# Patient Record
Sex: Male | Born: 1955 | Race: White | Hispanic: No | Marital: Single | Smoking: Former smoker
Health system: Southern US, Community
[De-identification: ages and names within clinical notes are randomized; demographics above are authoritative.]

## PROBLEM LIST (undated history)

## (undated) DIAGNOSIS — K219 Gastro-esophageal reflux disease without esophagitis: Secondary | ICD-10-CM

## (undated) DIAGNOSIS — T7840XA Allergy, unspecified, initial encounter: Secondary | ICD-10-CM

## (undated) DIAGNOSIS — Z8619 Personal history of other infectious and parasitic diseases: Secondary | ICD-10-CM

## (undated) DIAGNOSIS — B019 Varicella without complication: Secondary | ICD-10-CM

## (undated) HISTORY — DX: Allergy, unspecified, initial encounter: T78.40XA

## (undated) HISTORY — DX: Personal history of other infectious and parasitic diseases: Z86.19

## (undated) HISTORY — DX: Gastro-esophageal reflux disease without esophagitis: K21.9

## (undated) HISTORY — DX: Varicella without complication: B01.9

---

## 2001-06-07 HISTORY — PX: COLONOSCOPY: SHX174

## 2005-01-01 ENCOUNTER — Ambulatory Visit: Payer: Self-pay | Admitting: Internal Medicine

## 2005-04-12 ENCOUNTER — Ambulatory Visit: Payer: Self-pay | Admitting: Internal Medicine

## 2009-10-02 ENCOUNTER — Telehealth: Payer: Self-pay | Admitting: Internal Medicine

## 2009-10-06 ENCOUNTER — Ambulatory Visit: Payer: Self-pay | Admitting: Internal Medicine

## 2009-10-06 DIAGNOSIS — J309 Allergic rhinitis, unspecified: Secondary | ICD-10-CM | POA: Insufficient documentation

## 2010-07-07 NOTE — Assessment & Plan Note (Signed)
Summary: REEST/COUGH ALLERGIES/PS   Vital Signs:  [name] profile:   55 year old male Weight:      240 pounds Temp:     98.0 degrees F oral BP sitting:   100 / 60  (right arm) Cuff size:   regular  Vitals Entered By: Duard Brady LPN (Oct 07, 979 4:35 PM) CC: re-establish c/o cough   CC:  re-establish c/o cough.  History of Present Illness: Chase [name] who is seen today to reestablish with our practice.  He has whitish allergic rhinitis and following a URI.  Four weeks ago.  Has had a refractory nonproductive cough, some through the night, but a bit irritating to her today.  He was placed on a prednisone Dosepak two days ago.  He takes Zyrtec for allergic rhinitis.  Preventive Screening-Counseling & Management  Alcohol-Tobacco     Smoking Status: quit  Caffeine-Diet-Exercise     Does [name] Exercise: no  Allergies (verified): No Known Drug Allergies  Past History:  Past Medical History: Allergic rhinitis family history colon cancer  Family History: Reviewed history and no changes required. Father died colon Ca age  76 Mother age 72-  1 brother bladder ca  4 sisters  Social History: Reviewed history and no changes required. Married d/c tobacco 18 yrs ago Regular exercise-no Smoking Status:  quit Does [name] Exercise:  no  Review of Systems       The [name] complains of prolonged cough.  The [name] denies anorexia, fever, weight loss, weight gain, vision loss, decreased hearing, hoarseness, chest pain, syncope, dyspnea on exertion, peripheral edema, headaches, hemoptysis, abdominal pain, melena, hematochezia, severe indigestion/heartburn, hematuria, incontinence, genital sores, muscle weakness, suspicious skin lesions, transient blindness, difficulty walking, depression, unusual weight change, abnormal bleeding, enlarged lymph nodes, angioedema, breast masses, and testicular masses.    Physical Exam  General:  overweight-appearing.   low-normal blood pressureoverweight-appearing.   Head:  Normocephalic and atraumatic without obvious abnormalities. No apparent alopecia or balding. Eyes:  No corneal or conjunctival inflammation noted. EOMI. Perrla. Funduscopic exam benign, without hemorrhages, exudates or papilledema. Vision grossly normal. Ears:  External ear exam shows no significant lesions or deformities.  Otoscopic examination reveals clear canals, tympanic membranes are intact bilaterally without bulging, retraction, inflammation or discharge. Hearing is grossly normal bilaterally. Mouth:  Oral mucosa and oropharynx without lesions or exudates.  Teeth in good repair. Neck:  No deformities, masses, or tenderness noted. Chest Wall:  No deformities, masses, tenderness or gynecomastia noted. Lungs:  Normal respiratory effort, chest expands symmetrically. Lungs are clear to auscultation, no crackles or wheezes. Heart:  Normal rate and regular rhythm. S1 and S2 normal without gallop, murmur, click, rub or other extra sounds. Abdomen:  Bowel sounds positive,abdomen soft and non-tender without masses, organomegaly or hernias noted. Msk:  No deformity or scoliosis noted of thoracic or lumbar spine.   Pulses:  R and L carotid,radial,femoral,dorsalis pedis and posterior tibial pulses are full and equal bilaterally Extremities:  No clubbing, cyanosis, edema, or deformity noted with normal full range of motion of all joints.     Impression & Recommendations:  Problem # 1:  ALLERGIC RHINITIS (ICD-477.9)  His updated medication list for this problem includes:    Alavert 10 Mg Tabs (Loratadine)  His updated medication list for this problem includes:    Alavert 10 Mg Tabs (Loratadine)  Problem # 2:  NEOPLASM, MALIGNANT, COLON, FAMILY HX, FATHER (ICD-V16.0)  Complete Medication List: 1)  Prednisone (pak) 5 Mg Tabs (Prednisone) .... As  directed. 2)  Alavert 10 Mg Tabs (Loratadine) 3)  Hydrocodone-homatropine 5-1.5 Mg/69ml Syrp  (Hydrocodone-homatropine) .Marland Kitchen.. 1 teaspoon every 6 hours as needed for cough  [name] Instructions: 1)  Please schedule a follow-up appointment in 6 months. 2)  It is important that you exercise regularly at least 20 minutes 5 times a week. If you develop chest pain, have severe difficulty breathing, or feel very tired , stop exercising immediately and seek medical attention. 3)  You need to lose weight. Consider a lower calorie diet and regular exercise.  Prescriptions: HYDROCODONE-HOMATROPINE 5-1.5 MG/5ML SYRP (HYDROCODONE-HOMATROPINE) 1 teaspoon every 6 hours as needed for cough  #6 oz x 0   Entered and Authorized by:   Gordy Savers  MD   Signed by:   Gordy Savers  MD on 10/06/2009   Method used:   Print then Give to [name]   RxID:   [id number]

## 2010-07-07 NOTE — Progress Notes (Signed)
Summary: prednisone & alavert  Phone Note Call from Patient Call back at Home Phone [phone]   Reason for Call: Acute Illness Summary of Call: please have a triage nurse handle this  ---- 10/02/2009 9:21 AM, Lucy Antigua wrote: Dr. Caryl Never, Dr Fabian Sharp or Dr. Clent Ridges, Looking for a Dr that can see this pt late afternoon on Friday. Dr. Kirtland Bouchard will be out of the office and said that I can sch with another Dr. [name] was last seen in 2006 and possibly has pneumonia. Pls advise. Thank you.    ---- 10/02/2009 7:56 AM, Gordy Savers  MD wrote: I wil be out of office tomarrow; another MD OK  ---- 10/01/2009 2:06 PM, Lucy Antigua wrote: I rcvd a call from [facility]. Her husband, Chase [name] dob 04-Nov-1955, was a pt of yours and was last seen in 2006 according to [facility]. Pt is wanting to sch an ov to see you asap this Friday, late in the afternoon. Pt says that they may have pneumonia.  Please advise. Thank you.    [phone] Initial call taken by: Lucy Antigua,  October 02, 2009 10:27 AM  Follow-up for Phone Call        Called wife.  No fever.  Has horrible allergies, runny nose, cough nonproductive. Sometimes short breath when working.  Is at work today and cannot take off to reest & be seen.  Talked with wife about need to be seen today if they think he has pneumonia.  She says he does not need to be seen today because he can't get there for a 4pm appt without knowing ahead.   Wife insists he was in office 2008 for checkup & then had colon with [facility] physician.  Gave appointment 4pm 5-2 for symptoms & to reestablish.   Follow-up by: Rudy Jew, RN,  October 02, 2009 10:46 AM  Additional Follow-up for Phone Call Additional follow up Details #1::        noted please call in generic prednisone 6 days dose pack suggest OTV alavert Additional Follow-up by: Gordy Savers  MD,  October 02, 2009 12:37 PM    Additional Follow-up for Phone Call Additional follow up Details #2::    Wife  aware.  [facility]. Follow-up by: Rudy Jew, RN,  October 02, 2009 12:44 PM  New/Updated Medications: PREDNISONE (PAK) 5 MG TABS (PREDNISONE) As directed. ALAVERT 10 MG TABS (LORATADINE)  Prescriptions: PREDNISONE (PAK) 5 MG TABS (PREDNISONE) As directed.  #1 x 0   Entered by:   Rudy Jew, RN   Authorized by:   Gordy Savers  MD   Signed by:   Rudy Jew, RN on 10/02/2009   Method used:   [facility] [place] to ...         RxID:   [id number]

## 2014-01-22 ENCOUNTER — Ambulatory Visit (INDEPENDENT_AMBULATORY_CARE_PROVIDER_SITE_OTHER): Payer: No Typology Code available for payment source | Admitting: Internal Medicine

## 2014-01-22 ENCOUNTER — Ambulatory Visit (INDEPENDENT_AMBULATORY_CARE_PROVIDER_SITE_OTHER)
Admission: RE | Admit: 2014-01-22 | Discharge: 2014-01-22 | Disposition: A | Discharge status: Home / Self Care | Payer: No Typology Code available for payment source | Source: Ambulatory Visit | Attending: Internal Medicine | Admitting: Internal Medicine

## 2014-01-22 ENCOUNTER — Encounter: Payer: Self-pay | Admitting: Internal Medicine

## 2014-01-22 VITALS — BP 106/62 | HR 58 | Temp 98.1°F | Ht 75.0 in | Wt 216.0 lb

## 2014-01-22 DIAGNOSIS — Z575 Occupational exposure to toxic agents in other industries: Secondary | ICD-10-CM

## 2014-01-22 DIAGNOSIS — Z125 Encounter for screening for malignant neoplasm of prostate: Secondary | ICD-10-CM

## 2014-01-22 DIAGNOSIS — Z7729 Contact with and (suspected ) exposure to other hazardous substances: Secondary | ICD-10-CM

## 2014-01-22 DIAGNOSIS — Z0000 Encounter for general adult medical examination without abnormal findings: Secondary | ICD-10-CM

## 2014-01-22 DIAGNOSIS — H60399 Other infective otitis externa, unspecified ear: Secondary | ICD-10-CM

## 2014-01-22 DIAGNOSIS — H60393 Other infective otitis externa, bilateral: Secondary | ICD-10-CM

## 2014-01-22 LAB — COMPREHENSIVE METABOLIC PANEL
ALT: 15 U/L (ref 0–53)
AST: 16 U/L (ref 0–37)
Albumin: 4.3 g/dL (ref 3.5–5.2)
Alkaline Phosphatase: 65 U/L (ref 39–117)
BUN: 14 mg/dL (ref 6–23)
CO2: 27 meq/L (ref 19–32)
CREATININE: 0.9 mg/dL (ref 0.4–1.5)
Calcium: 9.6 mg/dL (ref 8.4–10.5)
Chloride: 104 mEq/L (ref 96–112)
GFR: 87.86 mL/min (ref >60.00)
GLUCOSE: 86 mg/dL (ref 70–99)
Potassium: 4.4 mEq/L (ref 3.5–5.1)
Sodium: 138 mEq/L (ref 135–145)
TOTAL PROTEIN: 7.3 g/dL (ref 6.0–8.3)
Total Bilirubin: 1 mg/dL (ref 0.2–1.2)

## 2014-01-22 LAB — CBC
HEMATOCRIT: 42.4 % (ref 39.0–52.0)
HEMOGLOBIN: 14.1 g/dL (ref 13.0–17.0)
MCHC: 33.1 g/dL (ref 30.0–36.0)
MCV: 95.6 fl (ref 78.0–100.0)
Platelets: 248 10*3/uL (ref 150.0–400.0)
RBC: 4.44 Mil/uL (ref 4.22–5.81)
RDW: 13.9 % (ref 11.5–15.5)
WBC: 7.1 10*3/uL (ref 4.0–10.5)

## 2014-01-22 LAB — PSA: PSA: 0.88 ng/mL (ref 0.10–4.00)

## 2014-01-22 LAB — LIPID PANEL
CHOLESTEROL: 150 mg/dL (ref 0–200)
HDL: 38.7 mg/dL — AB (ref >39.00)
LDL Cholesterol: 95 mg/dL (ref 0–99)
NONHDL: 111.3
Total CHOL/HDL Ratio: 4
Triglycerides: 84 mg/dL (ref 0.0–149.0)
VLDL: 16.8 mg/dL (ref 0.0–40.0)

## 2014-01-22 MED ORDER — NEOMYCIN-POLYMYXIN-HC 3.5-10000-1 OT SOLN: 3.0000 [drp] | Freq: Three times a day (TID) | OTIC | Status: DC

## 2014-01-22 NOTE — Progress Notes (Signed)
Pre visit review using our clinic review tool, if applicable. No additional management support is needed unless otherwise documented below in the visit note. 

## 2014-01-22 NOTE — Addendum Note (Signed)
Addended by: Ellamae Sia on: 01/22/2014 03:15 PM   Modules accepted: Orders

## 2014-01-22 NOTE — Progress Notes (Signed)
HPI  Pt presents to the clinic today to establish care. Chase [name] has not had a PCP in many years. Chase [name] does need a physical exam today including a CBC and chest xray for his job. Chase [name] does have some concerns today about drainage from his ears. Chase [name] reports Chase [name] gets this frequently. The drainage is tan. Chase [name] does have some itching in his ears but not pain or fever.  Flu: never Tetanus:> 10 years ago PSA Screen: never Colon Screen: 2003 Eye Doctor: as needed Dentist: as needed  Past Medical History  Diagnosis Date  . Chicken pox   . History of shingles   . Allergy   . GERD (gastroesophageal reflux disease)     No current outpatient prescriptions on file.   No current facility-administered medications for this visit.    No Known Allergies  Family History  Problem Relation Age of Onset  . Cancer Father     Colon    History   Social History  . Marital Status: Single    Spouse Name: N/A    Number of Children: N/A  . Years of Education: N/A   Occupational History  . Not on file.   Social History Main Topics  . Smoking status: Former Smoker    Types: Cigarettes  . Smokeless tobacco: Never Used     Comment: quit more than 20 years  . Alcohol Use: Yes     Comment: occasional  . Drug Use: Not on file  . Sexual Activity: Not on file   Other Topics Concern  . Not on file   Social History Narrative  . No narrative on file    ROS:  Constitutional: Denies fever, malaise, fatigue, headache or abrupt weight changes.  HEENT: Pt reports ear drainage. Denies eye pain, eye redness, ear pain, ringing in the ears, wax buildup, runny nose, nasal congestion, bloody nose, or sore throat. Respiratory: Denies difficulty breathing, shortness of breath, cough or sputum production.   Cardiovascular: Denies chest pain, chest tightness, palpitations or swelling in the hands or feet.  Gastrointestinal: Denies abdominal pain, bloating, constipation, diarrhea or blood in the stool.  GU: Denies  frequency, urgency, pain with urination, blood in urine, odor or discharge. Musculoskeletal: Denies decrease in range of motion, difficulty with gait, muscle pain or joint pain and swelling.  Skin: Denies redness, rashes, lesions or ulcercations.  Neurological: Denies dizziness, difficulty with memory, difficulty with speech or problems with balance and coordination.   No other specific complaints in a complete review of systems (except as listed in HPI above).  PE:  BP 106/62  Pulse 58  Temp(Src) 98.1 F (36.7 C) (Oral)  Ht 6\' 3"  (1.905 m)  Wt 216 lb (97.977 kg)  BMI 27.00 kg/m2  SpO2 98% Wt Readings from Last 3 Encounters:  01/22/14 216 lb (97.977 kg)  10/06/09 240 lb (108.863 kg)    General: Appears his stated age, well developed, well nourished in NAD. HEENT: Head: normal shape and size; Eyes: sclera white, no icterus, conjunctiva pink; Ears: Tm's gray and intact, normal light reflex, red ear canal with noticeable purulent drainage; Nose: mucosa pink and moist, septum midline; Throat/Mouth: Teeth present, mucosa pink and moist, no lesions or ulcerations noted.  Cardiovascular: Normal rate and rhythm. S1,S2 noted.  No murmur, rubs or gallops noted. No JVD or BLE edema. No carotid bruits noted. Pulmonary/Chest: Normal effort and positive vesicular breath sounds. No respiratory distress. No wheezes, rales or ronchi noted.  Abdomen: Soft and nontender. Normal bowel sounds,  no bruits noted. No distention or masses noted. Liver, spleen and kidneys non palpable. Neurological: Alert and oriented. Cranial nerves II-XII grossly intact.  Psychiatric: Mood and affect normal. Behavior is normal. Judgment and thought content normal.     Assessment and Plan:  Preventative Health Maintenance:  Chase [name] declines flu and Tdap today Encouraged him to continue to work on diet and exercise Will check PSA, CBC, CMET and lipid profile today Chest xray per employer requirements Will refer for  colonoscopy  Bilateral Otitis Externa:  eRx for Cortisporin drops RTC in 1 year or sooner if needed

## 2014-01-22 NOTE — Patient Instructions (Addendum)
Preventive Care for Adults A healthy lifestyle and preventive care can promote health and wellness. Preventive health guidelines for men include the following key practices:  A routine yearly physical is a good way to check with your health care provider about your health and preventative screening. It is a chance to share any concerns and updates on your health and to receive a thorough exam.  Visit your dentist for a routine exam and preventative care every 6 months. Brush your teeth twice a day and floss once a day. Good oral hygiene prevents tooth decay and gum disease.  The frequency of eye exams is based on your age, health, family medical history, use of contact lenses, and other factors. Follow your health care provider's recommendations for frequency of eye exams.  Eat a healthy diet. Foods such as vegetables, fruits, whole grains, low-fat dairy products, and lean protein foods contain the nutrients you need without too many calories. Decrease your intake of foods high in solid fats, added sugars, and salt. Eat the right amount of calories for you.Get information about a proper diet from your health care provider, if necessary.  Regular physical exercise is one of the most important things you can do for your health. Most adults should get at least 150 minutes of moderate-intensity exercise (any activity that increases your heart rate and causes you to sweat) each week. In addition, most adults need muscle-strengthening exercises on 2 or more days a week.  Maintain a healthy weight. The body mass index (BMI) is a screening tool to identify possible weight problems. It provides an estimate of body fat based on height and weight. Your health care provider can find your BMI and can help you achieve or maintain a healthy weight.For adults 20 years and older:  A BMI below 18.5 is considered underweight.  A BMI of 18.5 to 24.9 is normal.  A BMI of 25 to 29.9 is considered overweight.  A BMI  of 30 and above is considered obese.  Maintain normal blood lipids and cholesterol levels by exercising and minimizing your intake of saturated fat. Eat a balanced diet with plenty of fruit and vegetables. Blood tests for lipids and cholesterol should begin at age 50 and be repeated every 5 years. If your lipid or cholesterol levels are high, you are over 50, or you are at high risk for heart disease, you may need your cholesterol levels checked more frequently.Ongoing high lipid and cholesterol levels should be treated with medicines if diet and exercise are not working.  If you smoke, find out from your health care provider how to quit. If you do not use tobacco, do not start.  Lung cancer screening is recommended for adults aged 73-80 years who are at high risk for developing lung cancer because of a history of smoking. A yearly low-dose CT scan of the lungs is recommended for people who have at least a 30-pack-year history of smoking and are a current smoker or have quit within the past 15 years. A pack year of smoking is smoking an average of 1 pack of cigarettes a day for 1 year (for example: 1 pack a day for 30 years or 2 packs a day for 15 years). Yearly screening should continue until the smoker has stopped smoking for at least 15 years. Yearly screening should be stopped for people who develop a health problem that would prevent them from having lung cancer treatment.  If you choose to drink alcohol, do not have more than  2 drinks per day. One drink is considered to be 12 ounces (355 mL) of beer, 5 ounces (148 mL) of wine, or 1.5 ounces (44 mL) of liquor.  Avoid use of street drugs. Do not share needles with anyone. Ask for help if you need support or instructions about stopping the use of drugs.  High blood pressure causes heart disease and increases the risk of stroke. Your blood pressure should be checked at least every 1-2 years. Ongoing high blood pressure should be treated with  medicines, if weight loss and exercise are not effective.  If you are 45-79 years old, ask your health care provider if you should take aspirin to prevent heart disease.  Diabetes screening involves taking a blood sample to check your fasting blood sugar level. This should be done once every 3 years, after age 45, if you are within normal weight and without risk factors for diabetes. Testing should be considered at a younger age or be carried out more frequently if you are overweight and have at least 1 risk factor for diabetes.  Colorectal cancer can be detected and often prevented. Most routine colorectal cancer screening begins at the age of 50 and continues through age 75. However, your health care provider may recommend screening at an earlier age if you have risk factors for colon cancer. On a yearly basis, your health care provider may provide home test kits to check for hidden blood in the stool. Use of a small camera at the end of a tube to directly examine the colon (sigmoidoscopy or colonoscopy) can detect the earliest forms of colorectal cancer. Talk to your health care provider about this at age 50, when routine screening begins. Direct exam of the colon should be repeated every 5-10 years through age 75, unless early forms of precancerous polyps or small growths are found.  People who are at an increased risk for hepatitis B should be screened for this virus. You are considered at high risk for hepatitis B if:  You were born in a country where hepatitis B occurs often. Talk with your health care provider about which countries are considered high risk.  Your parents were born in a high-risk country and you have not received a shot to protect against hepatitis B (hepatitis B vaccine).  You have HIV or AIDS.  You use needles to inject street drugs.  You live with, or have sex with, someone who has hepatitis B.  You are a man who has sex with other men (MSM).  You get hemodialysis  treatment.  You take certain medicines for conditions such as cancer, organ transplantation, and autoimmune conditions.  Hepatitis C blood testing is recommended for all people born from 1945 through 1965 and any individual with known risks for hepatitis C.  Practice safe sex. Use condoms and avoid high-risk sexual practices to reduce the spread of sexually transmitted infections (STIs). STIs include gonorrhea, chlamydia, syphilis, trichomonas, herpes, HPV, and human immunodeficiency virus (HIV). Herpes, HIV, and HPV are viral illnesses that have no cure. They can result in disability, cancer, and death.  If you are at risk of being infected with HIV, it is recommended that you take a prescription medicine daily to prevent HIV infection. This is called preexposure prophylaxis (PrEP). You are considered at risk if:  You are a man who has sex with other men (MSM) and have other risk factors.  You are a heterosexual man, are sexually active, and are at increased risk for HIV infection.    You take drugs by injection.  You are sexually active with a partner who has HIV.  Talk with your health care provider about whether you are at high risk of being infected with HIV. If you choose to begin PrEP, you should first be tested for HIV. You should then be tested every 3 months for as long as you are taking PrEP.  A one-time screening for abdominal aortic aneurysm (AAA) and surgical repair of large AAAs by ultrasound are recommended for men ages 32 to 67 years who are current or former smokers.  Healthy men should no longer receive prostate-specific antigen (PSA) blood tests as part of routine cancer screening. Talk with your health care provider about prostate cancer screening.  Testicular cancer screening is not recommended for adult males who have no symptoms. Screening includes self-exam, a health care provider exam, and other screening tests. Consult with your health care provider about any symptoms  you have or any concerns you have about testicular cancer.  Use sunscreen. Apply sunscreen liberally and repeatedly throughout the day. You should seek shade when your shadow is shorter than you. Protect yourself by wearing long sleeves, pants, a wide-brimmed hat, and sunglasses year round, whenever you are outdoors.  Once a month, do a whole-body skin exam, using a mirror to look at the skin on your back. Tell your health care provider about new moles, moles that have irregular borders, moles that are larger than a pencil eraser, or moles that have changed in shape or color.  Stay current with required vaccines (immunizations).  Influenza vaccine. All adults should be immunized every year.  Tetanus, diphtheria, and acellular pertussis (Td, Tdap) vaccine. An adult who has not previously received Tdap or who does not know his vaccine status should receive 1 dose of Tdap. This initial dose should be followed by tetanus and diphtheria toxoids (Td) booster doses every 10 years. Adults with an unknown or incomplete history of completing a 3-dose immunization series with Td-containing vaccines should begin or complete a primary immunization series including a Tdap dose. Adults should receive a Td booster every 10 years.  Varicella vaccine. An adult without evidence of immunity to varicella should receive 2 doses or a second dose if he has previously received 1 dose.  Human papillomavirus (HPV) vaccine. Males aged 68-21 years who have not received the vaccine previously should receive the 3-dose series. Males aged 22-26 years may be immunized. Immunization is recommended through the age of 6 years for any male who has sex with males and did not get any or all doses earlier. Immunization is recommended for any person with an immunocompromised condition through the age of 49 years if he did not get any or all doses earlier. During the 3-dose series, the second dose should be obtained 4-8 weeks after the first  dose. The third dose should be obtained 24 weeks after the first dose and 16 weeks after the second dose.  Zoster vaccine. One dose is recommended for adults aged 50 years or older unless certain conditions are present.  Measles, mumps, and rubella (MMR) vaccine. Adults born before [birth year] generally are considered immune to measles and mumps. Adults born in [birth year] or later should have 1 or more doses of MMR vaccine unless there is a contraindication to the vaccine or there is laboratory evidence of immunity to each of the three diseases. A routine second dose of MMR vaccine should be obtained at least 28 days after the first dose for students attending postsecondary  schools, health care workers, or international travelers. People who received inactivated measles vaccine or an unknown type of measles vaccine during 1963-1967 should receive 2 doses of MMR vaccine. People who received inactivated mumps vaccine or an unknown type of mumps vaccine before 1979 and are at high risk for mumps infection should consider immunization with 2 doses of MMR vaccine. Unvaccinated health care workers born before 1957 who lack laboratory evidence of measles, mumps, or rubella immunity or laboratory confirmation of disease should consider measles and mumps immunization with 2 doses of MMR vaccine or rubella immunization with 1 dose of MMR vaccine.  Pneumococcal 13-valent conjugate (PCV13) vaccine. When indicated, a person who is uncertain of his immunization history and has no record of immunization should receive the PCV13 vaccine. An adult aged 19 years or older who has certain medical conditions and has not been previously immunized should receive 1 dose of PCV13 vaccine. This PCV13 should be followed with a dose of pneumococcal polysaccharide (PPSV23) vaccine. The PPSV23 vaccine dose should be obtained at least 8 weeks after the dose of PCV13 vaccine. An adult aged 19 years or older who has certain medical conditions and  previously received 1 or more doses of PPSV23 vaccine should receive 1 dose of PCV13. The PCV13 vaccine dose should be obtained 1 or more years after the last PPSV23 vaccine dose.  Pneumococcal polysaccharide (PPSV23) vaccine. When PCV13 is also indicated, PCV13 should be obtained first. All adults aged 65 years and older should be immunized. An adult younger than age 65 years who has certain medical conditions should be immunized. Any person who resides in a nursing home or long-term care facility should be immunized. An adult smoker should be immunized. People with an immunocompromised condition and certain other conditions should receive both PCV13 and PPSV23 vaccines. People with human immunodeficiency virus (HIV) infection should be immunized as soon as possible after diagnosis. Immunization during chemotherapy or radiation therapy should be avoided. Routine use of PPSV23 vaccine is not recommended for American Indians, [place] Natives, or people younger than 65 years unless there are medical conditions that require PPSV23 vaccine. When indicated, people who have unknown immunization and have no record of immunization should receive PPSV23 vaccine. One-time revaccination 5 years after the first dose of PPSV23 is recommended for people aged 19-64 years who have chronic kidney failure, nephrotic syndrome, asplenia, or immunocompromised conditions. People who received 1-2 doses of PPSV23 before age 65 years should receive another dose of PPSV23 vaccine at age 65 years or later if at least 5 years have passed since the previous dose. Doses of PPSV23 are not needed for people immunized with PPSV23 at or after age 65 years.  Meningococcal vaccine. Adults with asplenia or persistent complement component deficiencies should receive 2 doses of quadrivalent meningococcal conjugate (MenACWY-D) vaccine. The doses should be obtained at least 2 months apart. Microbiologists working with certain meningococcal bacteria,  military recruits, people at risk during an outbreak, and people who travel to or live in countries with a high rate of meningitis should be immunized. A first-year college student up through age 21 years who is living in a residence hall should receive a dose if he did not receive a dose on or after his 16th birthday. Adults who have certain high-risk conditions should receive one or more doses of vaccine.  Hepatitis A vaccine. Adults who wish to be protected from this disease, have certain high-risk conditions, work with hepatitis A-infected animals, work in hepatitis A research labs, or   travel to or work in countries with a high rate of hepatitis A should be immunized. Adults who were previously unvaccinated and who anticipate close contact with an international adoptee during the first 60 days after arrival in the [place] from a country with a high rate of hepatitis A should be immunized.  Hepatitis B vaccine. Adults should be immunized if they wish to be protected from this disease, have certain high-risk conditions, may be exposed to blood or other infectious body fluids, are household contacts or sex partners of hepatitis B positive people, are clients or workers in certain care facilities, or travel to or work in countries with a high rate of hepatitis B.  Haemophilus influenzae type b (Hib) vaccine. A previously unvaccinated person with asplenia or sickle cell disease or having a scheduled splenectomy should receive 1 dose of Hib vaccine. Regardless of previous immunization, a recipient of a hematopoietic stem cell transplant should receive a 3-dose series 6-12 months after his successful transplant. Hib vaccine is not recommended for adults with HIV infection. Preventive Service / Frequency Ages 52 to 17  Blood pressure check.** / Every 1 to 2 years.  Lipid and cholesterol check.** / Every 5 years beginning at age 69.  Hepatitis C blood test.** / For any individual with known risks for  hepatitis C.  Skin self-exam. / Monthly.  Influenza vaccine. / Every year.  Tetanus, diphtheria, and acellular pertussis (Tdap, Td) vaccine.** / Consult your health care provider. 1 dose of Td every 10 years.  Varicella vaccine.** / Consult your health care provider.  HPV vaccine. / 3 doses over 6 months, if 72 or younger.  Measles, mumps, rubella (MMR) vaccine.** / You need at least 1 dose of MMR if you were born in 1957 or later. You may also need a second dose.  Pneumococcal 13-valent conjugate (PCV13) vaccine.** / Consult your health care provider.  Pneumococcal polysaccharide (PPSV23) vaccine.** / 1 to 2 doses if you smoke cigarettes or if you have certain conditions.  Meningococcal vaccine.** / 1 dose if you are age 35 to 60 years and a Market researcher living in a residence hall, or have one of several medical conditions. You may also need additional booster doses.  Hepatitis A vaccine.** / Consult your health care provider.  Hepatitis B vaccine.** / Consult your health care provider.  Haemophilus influenzae type b (Hib) vaccine.** / Consult your health care provider. Ages 35 to 8  Blood pressure check.** / Every 1 to 2 years.  Lipid and cholesterol check.** / Every 5 years beginning at age 57.  Lung cancer screening. / Every year if you are aged 44-80 years and have a 30-pack-year history of smoking and currently smoke or have quit within the past 15 years. Yearly screening is stopped once you have quit smoking for at least 15 years or develop a health problem that would prevent you from having lung cancer treatment.  Fecal occult blood test (FOBT) of stool. / Every year beginning at age 55 and continuing until age 73. You may not have to do this test if you get a colonoscopy every 10 years.  Flexible sigmoidoscopy** or colonoscopy.** / Every 5 years for a flexible sigmoidoscopy or every 10 years for a colonoscopy beginning at age 28 and continuing until age  1.  Hepatitis C blood test.** / For all people born from [birth year] through 1965 and any individual with known risks for hepatitis C.  Skin self-exam. / Monthly.  Influenza vaccine. / Every  year.  Tetanus, diphtheria, and acellular pertussis (Tdap/Td) vaccine.** / Consult your health care provider. 1 dose of Td every 10 years.  Varicella vaccine.** / Consult your health care provider.  Zoster vaccine.** / 1 dose for adults aged 53 years or older.  Measles, mumps, rubella (MMR) vaccine.** / You need at least 1 dose of MMR if you were born in 1957 or later. You may also need a second dose.  Pneumococcal 13-valent conjugate (PCV13) vaccine.** / Consult your health care provider.  Pneumococcal polysaccharide (PPSV23) vaccine.** / 1 to 2 doses if you smoke cigarettes or if you have certain conditions.  Meningococcal vaccine.** / Consult your health care provider.  Hepatitis A vaccine.** / Consult your health care provider.  Hepatitis B vaccine.** / Consult your health care provider.  Haemophilus influenzae type b (Hib) vaccine.** / Consult your health care provider. Ages 77 and over  Blood pressure check.** / Every 1 to 2 years.  Lipid and cholesterol check.**/ Every 5 years beginning at age 85.  Lung cancer screening. / Every year if you are aged 55-80 years and have a 30-pack-year history of smoking and currently smoke or have quit within the past 15 years. Yearly screening is stopped once you have quit smoking for at least 15 years or develop a health problem that would prevent you from having lung cancer treatment.  Fecal occult blood test (FOBT) of stool. / Every year beginning at age 33 and continuing until age 11. You may not have to do this test if you get a colonoscopy every 10 years.  Flexible sigmoidoscopy** or colonoscopy.** / Every 5 years for a flexible sigmoidoscopy or every 10 years for a colonoscopy beginning at age 28 and continuing until age 73.  Hepatitis C blood  test.** / For all people born from [birth year] through 1965 and any individual with known risks for hepatitis C.  Abdominal aortic aneurysm (AAA) screening.** / A one-time screening for ages 50 to 27 years who are current or former smokers.  Skin self-exam. / Monthly.  Influenza vaccine. / Every year.  Tetanus, diphtheria, and acellular pertussis (Tdap/Td) vaccine.** / 1 dose of Td every 10 years.  Varicella vaccine.** / Consult your health care provider.  Zoster vaccine.** / 1 dose for adults aged 34 years or older.  Pneumococcal 13-valent conjugate (PCV13) vaccine.** / Consult your health care provider.  Pneumococcal polysaccharide (PPSV23) vaccine.** / 1 dose for all adults aged 63 years and older.  Meningococcal vaccine.** / Consult your health care provider.  Hepatitis A vaccine.** / Consult your health care provider.  Hepatitis B vaccine.** / Consult your health care provider.  Haemophilus influenzae type b (Hib) vaccine.** / Consult your health care provider. **Family history and personal history of risk and conditions may change your health care provider's recommendations. Document Released: 07/20/2001 Document Revised: 05/29/2013 Document Reviewed: 10/19/2010 [facility] Patient Information 2015 [place], [place]. This information is not intended to replace advice given to you by your health care provider. Make sure you discuss any questions you have with your health care provider.

## 2014-01-25 ENCOUNTER — Ambulatory Visit (INDEPENDENT_AMBULATORY_CARE_PROVIDER_SITE_OTHER)
Admission: RE | Admit: 2014-01-25 | Discharge: 2014-01-25 | Disposition: A | Discharge status: Home / Self Care | Payer: No Typology Code available for payment source | Source: Ambulatory Visit | Attending: Internal Medicine | Admitting: Internal Medicine

## 2014-01-25 DIAGNOSIS — Z0000 Encounter for general adult medical examination without abnormal findings: Secondary | ICD-10-CM

## 2014-01-28 ENCOUNTER — Encounter: Payer: Self-pay | Admitting: Gastroenterology

## 2014-03-01 ENCOUNTER — Ambulatory Visit (AMBULATORY_SURGERY_CENTER): Payer: Self-pay | Admitting: *Deleted

## 2014-03-01 VITALS — Ht 77.0 in | Wt 216.4 lb

## 2014-03-01 DIAGNOSIS — Z800 Family history of malignant neoplasm of digestive organs: Secondary | ICD-10-CM

## 2014-03-01 MED ORDER — NA SULFATE-K SULFATE-MG SULF 17.5-3.13-1.6 GM/177ML PO SOLN: 1.0000 | Freq: Once | ORAL | Status: DC

## 2014-03-01 NOTE — Progress Notes (Signed)
No home 02 use. No egg or soy allergy. ewm No issues with last colon sedation. emw No diet pills. ewm No emmi per pt. ewm

## 2014-03-15 ENCOUNTER — Encounter: Payer: Self-pay | Admitting: Gastroenterology

## 2014-03-15 ENCOUNTER — Ambulatory Visit (AMBULATORY_SURGERY_CENTER): Payer: No Typology Code available for payment source | Admitting: Gastroenterology

## 2014-03-15 VITALS — BP 101/72 | HR 48 | Temp 97.4°F | Resp 14 | Ht 77.0 in | Wt 216.0 lb

## 2014-03-15 DIAGNOSIS — K5730 Diverticulosis of large intestine without perforation or abscess without bleeding: Secondary | ICD-10-CM

## 2014-03-15 DIAGNOSIS — D124 Benign neoplasm of descending colon: Secondary | ICD-10-CM

## 2014-03-15 DIAGNOSIS — K635 Polyp of colon: Secondary | ICD-10-CM

## 2014-03-15 DIAGNOSIS — Z1211 Encounter for screening for malignant neoplasm of colon: Secondary | ICD-10-CM

## 2014-03-15 DIAGNOSIS — Z800 Family history of malignant neoplasm of digestive organs: Secondary | ICD-10-CM

## 2014-03-15 MED ORDER — SODIUM CHLORIDE 0.9 % IV SOLN: 500.0000 mL | INTRAVENOUS | Status: DC

## 2014-03-15 NOTE — Op Note (Signed)
[facility]  [address] [place], [id number]   COLONOSCOPY PROCEDURE REPORT  PATIENT: Chase [name], Chase [name]  MR#: [id number] BIRTHDATE: [birth year] , 60  yrs. old GENDER: male ENDOSCOPIST: Inda Castle, MD REFERRED BY: PROCEDURE DATE:  03/15/2014 PROCEDURE:   Colonoscopy with snare polypectomy First Screening Colonoscopy - Avg.  risk and is 50 yrs.  old or older - No.  Prior Negative Screening - Now for repeat screening. 10 or more years since last screening  History of Adenoma - Now for follow-up colonoscopy & has been > or = to 3 yrs.  N/A  Polyps Removed Today? Yes. ASA CLASS:   Class II INDICATIONS:average risk for colon cancer. MEDICATIONS: Monitored anesthesia care and Propofol 250 mg IV  DESCRIPTION OF PROCEDURE:   After the risks benefits and alternatives of the procedure were thoroughly explained, informed consent was obtained.  The digital rectal exam revealed no abnormalities of the rectum.   The [id number]  endoscope was introduced through the anus and advanced to the cecum, which was identified by both the appendix and ileocecal valve. No adverse events experienced.   The quality of the prep was Suprep good  The instrument was then slowly withdrawn as the colon was fully examined.      COLON FINDINGS: There was severe diverticulosis noted in the descending colon and sigmoid colon with associated muscular hypertrophy.   A sessile polyp measuring 3 mm in size was found in the descending colon.  A polypectomy was performed with a cold snare.  The resection was complete, the polyp tissue was completely retrieved and sent to histology.   The examination was otherwise normal.  Retroflexed views revealed no abnormalities. The time to cecum=3 minutes 41 seconds.  Withdrawal time=11 minutes 20 seconds. The scope was withdrawn and the procedure completed. COMPLICATIONS: There were no immediate complications.  ENDOSCOPIC IMPRESSION: 1.   There  was severe diverticulosis noted in the descending colon and sigmoid colon 2.   Sessile polyp measuring 3 mm in size was found in the descending colon; polypectomy was performed with a cold snare 3.   The examination was otherwise normal  RECOMMENDATIONS: If the polyp(s) removed today are proven to be adenomatous (pre-cancerous) polyps, you will need a repeat colonoscopy in 5 years.  Otherwise you should continue to follow colorectal cancer screening guidelines for "routine risk" patients with colonoscopy in 10 years.  You will receive a letter within 1-2 weeks with the results of your biopsy as well as final recommendations.  Please call my office if you have not received a letter after 3 weeks.  eSigned:  Inda Castle, MD 03/15/2014 2:54 PM   cc: Webb Silversmith, MD   PATIENT NAME:  Chase [name], Chase [name] MR#: [id number]

## 2014-03-15 NOTE — Patient Instructions (Signed)
YOU HAD AN ENDOSCOPIC PROCEDURE TODAY AT THE [facility]: Refer to the procedure report that was given to you for any specific questions about what was found during the examination.  If the procedure report does not answer your questions, please call your gastroenterologist to clarify.  If you requested that your care partner not be given the details of your procedure findings, then the procedure report has been included in a sealed envelope for you to review at your convenience later.  YOU SHOULD EXPECT: Some feelings of bloating in the abdomen. Passage of more gas than usual.  Walking can help get rid of the air that was put into your GI tract during the procedure and reduce the bloating. If you had a lower endoscopy (such as a colonoscopy or flexible sigmoidoscopy) you may notice spotting of blood in your stool or on the toilet paper. If you underwent a bowel prep for your procedure, then you may not have a normal bowel movement for a few days.  DIET: Your first meal following the procedure should be a light meal and then it is ok to progress to your normal diet.  A half-sandwich or bowl of soup is an example of a good first meal.  Heavy or fried foods are harder to digest and may make you feel nauseous or bloated.  Likewise meals heavy in dairy and vegetables can cause extra gas to form and this can also increase the bloating.  Drink plenty of fluids but you should avoid alcoholic beverages for 24 hours.  ACTIVITY: Your care partner should take you home directly after the procedure.  You should plan to take it easy, moving slowly for the rest of the day.  You can resume normal activity the day after the procedure however you should NOT DRIVE or use heavy machinery for 24 hours (because of the sedation medicines used during the test).    SYMPTOMS TO REPORT IMMEDIATELY: A gastroenterologist can be reached at any hour.  During normal business hours, 8:30 AM to 5:00 PM Monday through Friday,  call [phone].  After hours and on weekends, please call the GI answering service at [phone] who will take a message and have the physician on call contact you.   Following lower endoscopy (colonoscopy or flexible sigmoidoscopy):  Excessive amounts of blood in the stool  Significant tenderness or worsening of abdominal pains  Swelling of the abdomen that is new, acute  Fever of 100F or higher    FOLLOW UP: If any biopsies were taken you will be contacted by phone or by letter within the next 1-3 weeks.  Call your gastroenterologist if you have not heard about the biopsies in 3 weeks.  Our staff will call the home number listed on your records the next business day following your procedure to check on you and address any questions or concerns that you may have at that time regarding the information given to you following your procedure. This is a courtesy call and so if there is no answer at the home number and we have not heard from you through the emergency physician on call, we will assume that you have returned to your regular daily activities without incident.  SIGNATURES/CONFIDENTIALITY: You and/or your care partner have signed paperwork which will be entered into your electronic medical record.  These signatures attest to the fact that that the information above on your After Visit Summary has been reviewed and is understood.  Full responsibility of the confidentiality   of this discharge information lies with you and/or your care-partner.     

## 2014-03-15 NOTE — Progress Notes (Signed)
Called to room to assist during endoscopic procedure.  Patient ID and intended procedure confirmed with present staff. Received instructions for my participation in the procedure from the performing physician.  

## 2014-03-15 NOTE — Progress Notes (Signed)
Discharge instructions given including handouts regarding polyps, Diverticulosis and High fiber. Patient and carepartner verbalized understanding.

## 2014-03-15 NOTE — Progress Notes (Signed)
Procedure ends, to recovery, report given and VSS. 

## 2014-03-18 ENCOUNTER — Telehealth: Payer: Self-pay

## 2014-03-18 NOTE — Telephone Encounter (Signed)
Unable to leave message no set up.

## 2014-03-20 ENCOUNTER — Encounter: Payer: Self-pay | Admitting: Gastroenterology

## 2015-04-07 ENCOUNTER — Telehealth: Payer: Self-pay

## 2015-04-07 DIAGNOSIS — T753XXA Motion sickness, initial encounter: Secondary | ICD-10-CM

## 2015-04-07 MED ORDER — SCOPOLAMINE 1 MG/3DAYS TD PT72: MEDICATED_PATCH | TRANSDERMAL | Status: AC

## 2015-04-07 NOTE — Telephone Encounter (Signed)
Pt is requesting patch to prevent sea sickness; pt leaving for cruise on 04/11/15 for 7 days. Pt has used sea sickness patch years ago.[facility]. Pt was seen 01/22/14 to establish care; do not see future appt scheduled. Avie Echevaria NP out of office this week.

## 2015-04-07 NOTE — Telephone Encounter (Signed)
Please notify Chase [name] that I've sent in scopolamine patches to his pharmacy to prevent motion sickness while on his cruise. He is to place 1 patch behind his ear on a hairless area once every 3 days. He should apply the patch 2-3 hours prior to his cruise.

## 2015-04-07 NOTE — Telephone Encounter (Signed)
Pre visit review using our clinic review tool, if applicable. No additional management support is needed unless otherwise documented below in the visit note. 

## 2016-06-18 IMAGING — CR DG CHEST 2V
2 series · 2 of 2 positions shown · non-contrast
Comparison: None.

CLINICAL DATA: Exposure to ethylene oxide.

EXAM:
CHEST  2 VIEW

[view not recorded (1 of 2)]
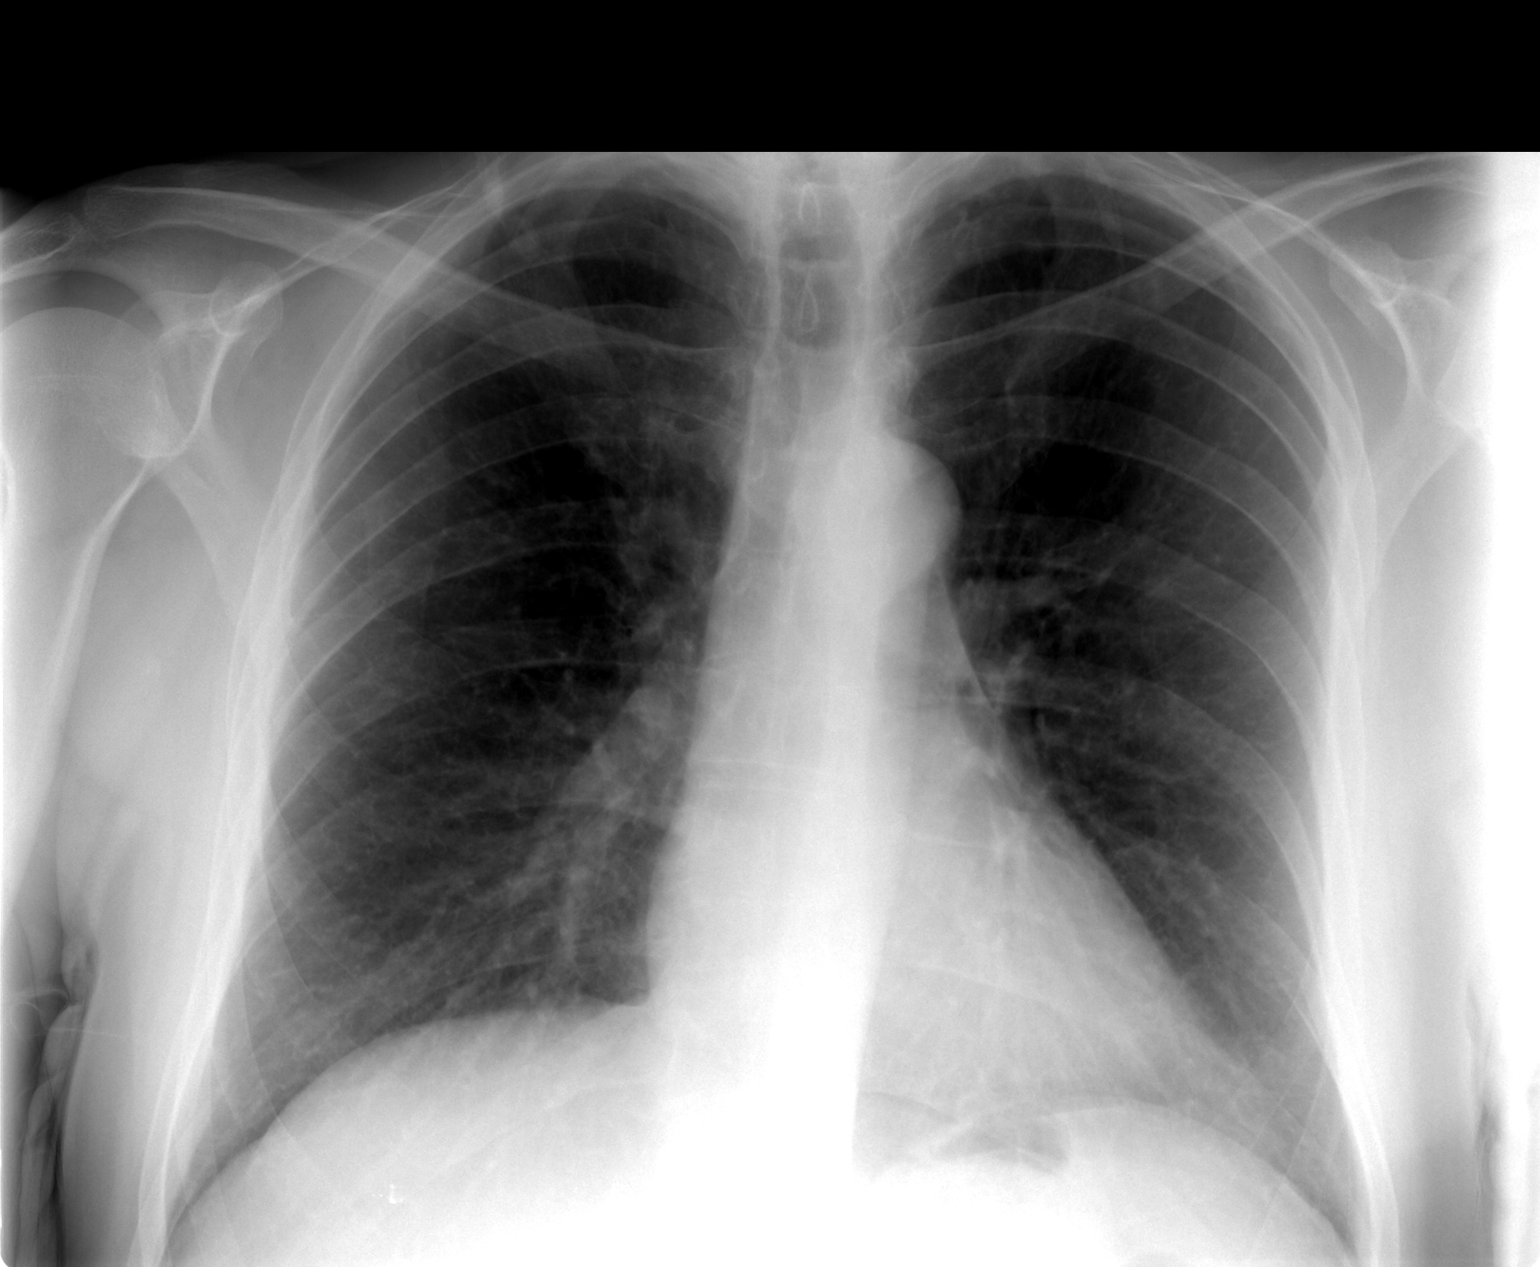

[view not recorded (2 of 2)]
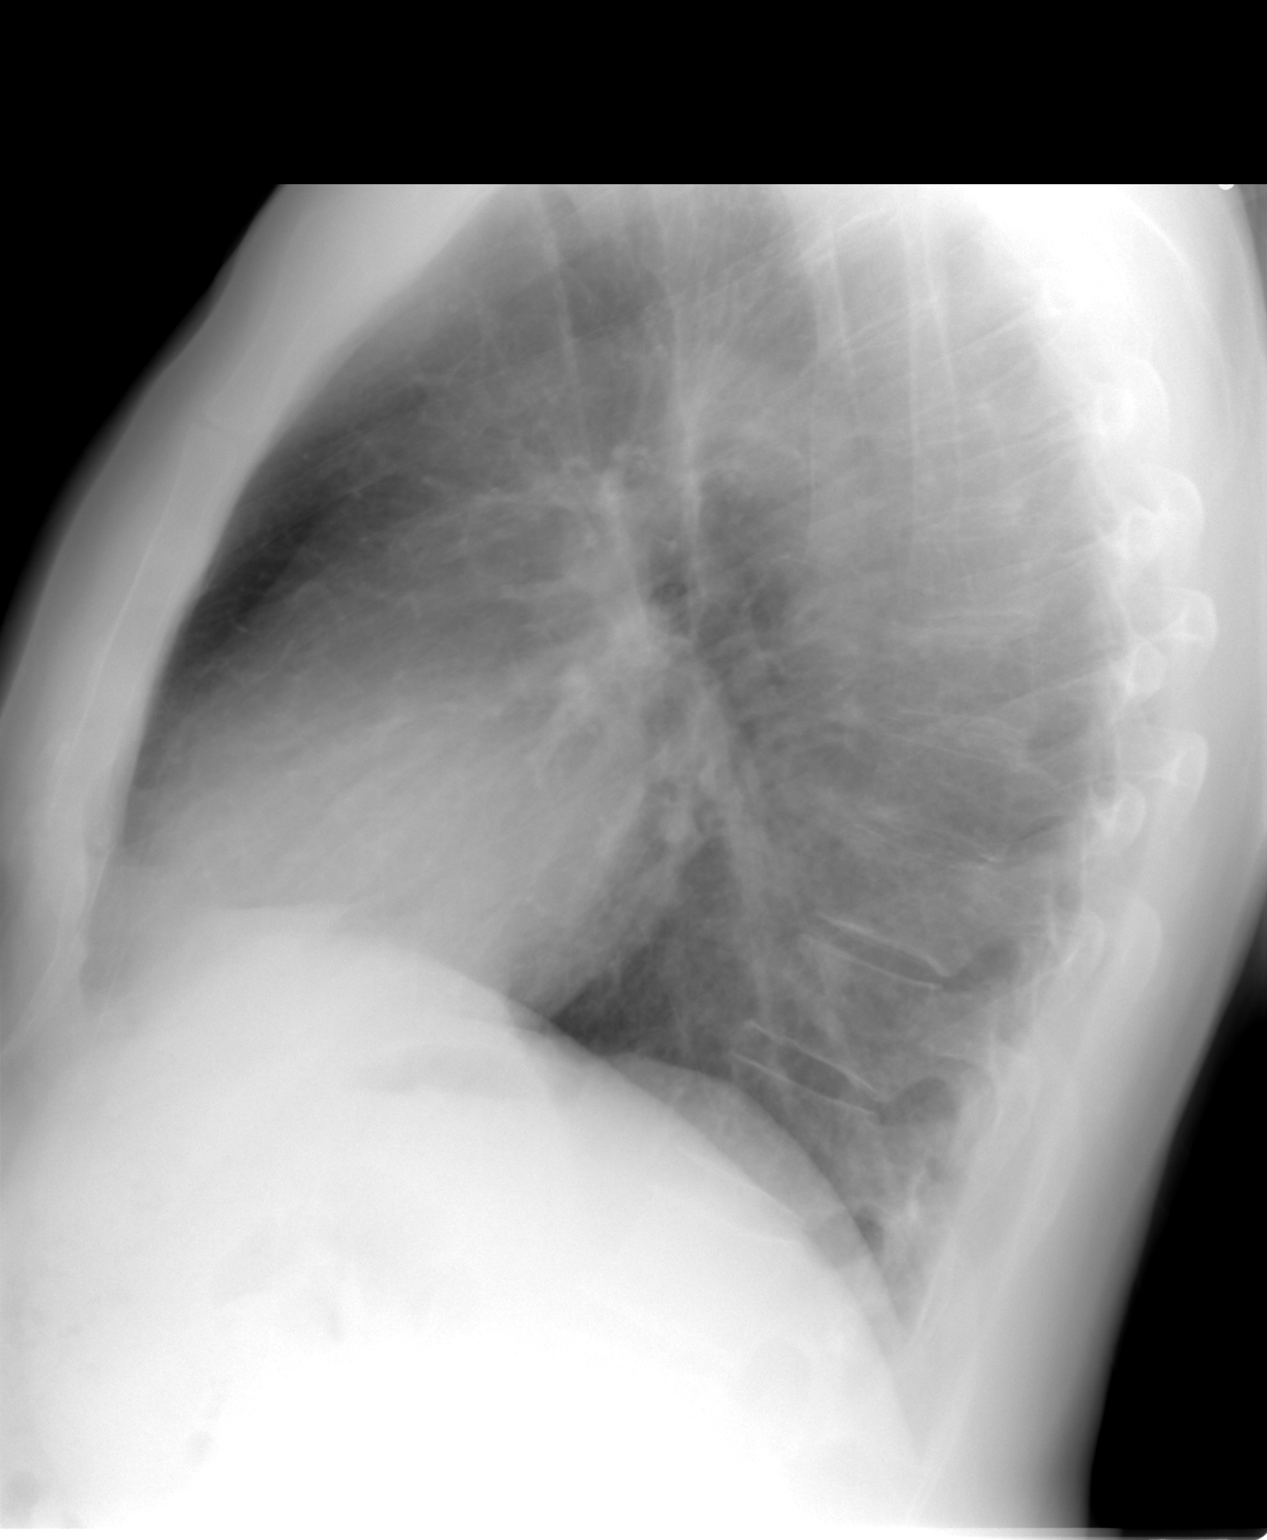

[2 of 2 positions shown; findings below may reference images not displayed]

FINDINGS: Two views of the chest were obtained. There is an oval-shaped 1.4 cm
density overlying the right lung apex. It is unclear if this is
external to the patient or within the lung parenchyma. Otherwise,
the lungs are clear. Heart and mediastinum are within normal limits.
No acute bone abnormality.
IMPRESSION: No acute cardiopulmonary disease.

Indeterminate 1.4 cm density in the right upper chest region. It is
possible that this structure is overlying the patient and recommend
a follow-up chest radiograph exam with lordotic views.

## 2019-03-28 ENCOUNTER — Encounter: Payer: Self-pay | Admitting: Gastroenterology
